# Patient Record
Sex: Female | Born: 1970 | Race: White | Hispanic: No | Marital: Single | Smoking: Never smoker
Health system: Southern US, Community
[De-identification: ages and names within clinical notes are randomized; demographics above are authoritative.]

---

## 2022-03-10 ENCOUNTER — Observation Stay
Admission: EM | Admit: 2022-03-10 | Discharge: 2022-03-11 | Discharge status: Against Medical Advice | Payer: BC Managed Care – PPO | Attending: Internal Medicine | Admitting: Internal Medicine

## 2022-03-10 ENCOUNTER — Other Ambulatory Visit: Payer: Self-pay

## 2022-03-10 ENCOUNTER — Emergency Department: Payer: BC Managed Care – PPO

## 2022-03-10 DIAGNOSIS — I10 Essential (primary) hypertension: Secondary | ICD-10-CM | POA: Insufficient documentation

## 2022-03-10 DIAGNOSIS — Z79899 Other long term (current) drug therapy: Secondary | ICD-10-CM | POA: Diagnosis not present

## 2022-03-10 DIAGNOSIS — I208 Other forms of angina pectoris: Secondary | ICD-10-CM

## 2022-03-10 DIAGNOSIS — R072 Precordial pain: Secondary | ICD-10-CM | POA: Diagnosis not present

## 2022-03-10 DIAGNOSIS — Z20822 Contact with and (suspected) exposure to covid-19: Secondary | ICD-10-CM | POA: Diagnosis not present

## 2022-03-10 DIAGNOSIS — Z7901 Long term (current) use of anticoagulants: Secondary | ICD-10-CM | POA: Diagnosis not present

## 2022-03-10 DIAGNOSIS — I480 Paroxysmal atrial fibrillation: Secondary | ICD-10-CM | POA: Insufficient documentation

## 2022-03-10 DIAGNOSIS — F17210 Nicotine dependence, cigarettes, uncomplicated: Secondary | ICD-10-CM | POA: Diagnosis not present

## 2022-03-10 DIAGNOSIS — R079 Chest pain, unspecified: Secondary | ICD-10-CM | POA: Diagnosis present

## 2022-03-10 DIAGNOSIS — R9431 Abnormal electrocardiogram [ECG] [EKG]: Secondary | ICD-10-CM | POA: Insufficient documentation

## 2022-03-10 LAB — BASIC METABOLIC PANEL
Anion gap: 7 (ref 5–15)
BUN: 11 mg/dL (ref 6–20)
CO2: 25 mmol/L (ref 22–32)
Calcium: 9.7 mg/dL (ref 8.9–10.3)
Chloride: 105 mmol/L (ref 98–111)
Creatinine, Ser: 0.95 mg/dL (ref 0.44–1.00)
GFR, Estimated: >60 mL/min (ref >60)
Glucose, Bld: 93 mg/dL (ref 70–99)
Potassium: 3.9 mmol/L (ref 3.5–5.1)
Sodium: 137 mmol/L (ref 135–145)

## 2022-03-10 LAB — URINE DRUG SCREEN, QUALITATIVE (ARMC ONLY)
Amphetamines, Ur Screen: NOT DETECTED
Barbiturates, Ur Screen: NOT DETECTED
Benzodiazepine, Ur Scrn: POSITIVE — AB
Cannabinoid 50 Ng, Ur ~~LOC~~: POSITIVE — AB
Cocaine Metabolite,Ur ~~LOC~~: NOT DETECTED
MDMA (Ecstasy)Ur Screen: NOT DETECTED
Methadone Scn, Ur: NOT DETECTED
Opiate, Ur Screen: NOT DETECTED
Phencyclidine (PCP) Ur S: NOT DETECTED
Tricyclic, Ur Screen: NOT DETECTED

## 2022-03-10 LAB — CBC
HCT: 45.1 % (ref 36.0–46.0)
Hemoglobin: 15.1 g/dL — ABNORMAL HIGH (ref 12.0–15.0)
MCH: 32 pg (ref 26.0–34.0)
MCHC: 33.5 g/dL (ref 30.0–36.0)
MCV: 95.6 fL (ref 80.0–100.0)
Platelets: 325 10*3/uL (ref 150–400)
RBC: 4.72 MIL/uL (ref 3.87–5.11)
RDW: 12.2 % (ref 11.5–15.5)
WBC: 9.7 10*3/uL (ref 4.0–10.5)
nRBC: 0 % (ref 0.0–0.2)

## 2022-03-10 LAB — TROPONIN I (HIGH SENSITIVITY)
Troponin I (High Sensitivity): 12 ng/L (ref <18)
Troponin I (High Sensitivity): 15 ng/L (ref <18)

## 2022-03-10 LAB — LIPID PANEL
Cholesterol: 252 mg/dL — ABNORMAL HIGH (ref 0–200)
HDL: 53 mg/dL (ref >40)
LDL Cholesterol: 176 mg/dL — ABNORMAL HIGH (ref 0–99)
Total CHOL/HDL Ratio: 4.8 RATIO
Triglycerides: 115 mg/dL (ref <150)
VLDL: 23 mg/dL (ref 0–40)

## 2022-03-10 LAB — RESP PANEL BY RT-PCR (FLU A&B, COVID) ARPGX2
Influenza A by PCR: NEGATIVE
Influenza B by PCR: NEGATIVE
SARS Coronavirus 2 by RT PCR: NEGATIVE

## 2022-03-10 LAB — TSH: TSH: 1.43 u[IU]/mL (ref 0.350–4.500)

## 2022-03-10 MED ORDER — ATORVASTATIN CALCIUM 20 MG PO TABS
40.0000 mg | ORAL_TABLET | Freq: Every day | ORAL | Status: DC
  Administered 2022-03-10: 40 mg via ORAL
  Filled 2022-03-10: qty 2

## 2022-03-10 MED ORDER — ONDANSETRON HCL 4 MG/2ML IJ SOLN: 4.0000 mg | Freq: Four times a day (QID) | INTRAMUSCULAR | Status: DC | PRN

## 2022-03-10 MED ORDER — SERTRALINE HCL 50 MG PO TABS
100.0000 mg | ORAL_TABLET | Freq: Every day | ORAL | Status: DC
  Administered 2022-03-10: 100 mg via ORAL
  Filled 2022-03-10: qty 2

## 2022-03-10 MED ORDER — APIXABAN 5 MG PO TABS
5.0000 mg | ORAL_TABLET | Freq: Two times a day (BID) | ORAL | Status: DC
  Administered 2022-03-10: 5 mg via ORAL
  Filled 2022-03-10 (×2): qty 1

## 2022-03-10 MED ORDER — ACETAMINOPHEN 325 MG PO TABS: 650.0000 mg | ORAL_TABLET | ORAL | Status: DC | PRN

## 2022-03-10 MED ORDER — ASPIRIN 81 MG PO CHEW
324.0000 mg | CHEWABLE_TABLET | Freq: Once | ORAL | Status: DC
Start: 2022-03-10 — End: 2022-03-10
  Filled 2022-03-10: qty 4

## 2022-03-10 MED ORDER — NICOTINE 21 MG/24HR TD PT24: 21.0000 mg | MEDICATED_PATCH | Freq: Every day | TRANSDERMAL | Status: DC

## 2022-03-10 MED ORDER — ALPRAZOLAM 0.5 MG PO TABS
0.5000 mg | ORAL_TABLET | Freq: Once | ORAL | Status: AC
  Administered 2022-03-10: 0.5 mg via ORAL
  Filled 2022-03-10: qty 1

## 2022-03-10 MED ORDER — ONDANSETRON 4 MG PO TBDP: 4.0000 mg | ORAL_TABLET | Freq: Four times a day (QID) | ORAL | Status: DC | PRN

## 2022-03-10 MED ORDER — IBUPROFEN 400 MG PO TABS
600.0000 mg | ORAL_TABLET | Freq: Four times a day (QID) | ORAL | Status: DC | PRN
  Administered 2022-03-10: 600 mg via ORAL
  Filled 2022-03-10: qty 2

## 2022-03-10 MED ORDER — CLONAZEPAM 0.5 MG PO TABS
0.5000 mg | ORAL_TABLET | Freq: Every evening | ORAL | Status: DC | PRN
  Administered 2022-03-10: 0.5 mg via ORAL
  Filled 2022-03-10: qty 1

## 2022-03-10 NOTE — H&P (Addendum)
?History and Physical  ? ? ?Sara Salas URK:[id number] DOB: [birth year] DOA: 03/10/2022 ? ?PCP: Gale Journey, MD (Confirm with patient/family/NH records and if not entered, this has to be entered at [facility] point of entry) ?Patient coming from: Home ? ?I have personally briefly reviewed patient's old medical records in [facility] ? ?Chief Complaint: Chest pain ? ?HPI: Sara Salas is a 51 y.o. female with medical history significant of PAF on Eliquis, HTN, cigar smoker, presented with chest pain.  Symptoms started this morning, while at rest, retrosternal, pressure-like, 8-9/10, associated with shortness of breath.  Episode lasted for about an hour.  There seemed no exacerbation or alleviating factors. ? ?ED Course: Bradycardia, no hypotension ? ?EKG showed T wave inversions on multiple leads which is new.  Troponin negative x2. ? ?Review of Systems: As per HPI otherwise 14 point review of systems negative.  ? ? ?History reviewed. No pertinent past medical history. ? ?History reviewed. No pertinent surgical history. ? ? reports that she has never smoked. She has never used smokeless tobacco. She reports that she does not currently use alcohol. She reports that she does not use drugs. ? ?Not on File ? ?No family history on file. ? ? ?Prior to Admission medications   ?Medication Sig Start Date End Date Taking? Authorizing Provider  ?clonazePAM (KLONOPIN) 0.5 MG tablet Take 1 tablet by mouth at bedtime as needed. 02/13/22 03/15/22 Yes [provider]  ?sertraline (ZOLOFT) 100 MG tablet Take 1 tablet by mouth daily. 02/13/22 02/13/23 Yes [provider]  ? ? ?Physical Exam: ?Vitals:  ? 03/10/22 0932 03/10/22 1245  ?BP: 138/66 120/60  ?Pulse: (!) 41 (!) 44  ?Resp: 18 20  ?Temp: 98 ?F (36.7 ?C)   ?SpO2: 100% 100%  ? ? ?Constitutional: NAD, calm, comfortable ?Vitals:  ? 03/10/22 0932 03/10/22 1245  ?BP: 138/66 120/60  ?Pulse: (!) 41 (!) 44  ?Resp: 18 20  ?Temp: 98 ?F (36.7 ?C)   ?SpO2: 100% 100%  ? ?Eyes:  PERRL, lids and conjunctivae normal ?ENMT: Mucous membranes are moist. Posterior pharynx clear of any exudate or lesions.Normal dentition.  ?Neck: normal, supple, no masses, no thyromegaly ?Respiratory: clear to auscultation bilaterally, no wheezing, no crackles. Normal respiratory effort. No accessory muscle use.  ?Cardiovascular: Regular rate and rhythm, no murmurs / rubs / gallops. No extremity edema. 2+ pedal pulses. No carotid bruits.  ?Abdomen: no tenderness, no masses palpated. No hepatosplenomegaly. Bowel sounds positive.  ?Musculoskeletal: no clubbing / cyanosis. No joint deformity upper and lower extremities. Good ROM, no contractures. Normal muscle tone.  ?Skin: no rashes, lesions, ulcers. No induration ?Neurologic: CN 2-12 grossly intact. Sensation intact, DTR normal. Strength 5/5 in all 4.  ?Psychiatric: Normal judgment and insight. Alert and oriented x 3. Normal mood.  ? ? ? ?Labs on Admission: I have personally reviewed following labs and imaging studies ? ?CBC: ?Recent Labs  ?Lab 03/10/22 ?0954  ?WBC 9.7  ?HGB 15.1*  ?HCT 45.1  ?MCV 95.6  ?PLT 325  ? ?Basic Metabolic Panel: ?Recent Labs  ?Lab 03/10/22 ?0954  ?NA 137  ?K 3.9  ?CL 105  ?CO2 25  ?GLUCOSE 93  ?BUN 11  ?CREATININE 0.95  ?CALCIUM 9.7  ? ?GFR: ?CrCl cannot be calculated (Unknown ideal weight.). ?Liver Function Tests: ?No results for input(s): AST, ALT, ALKPHOS, BILITOT, PROT, ALBUMIN in the last 168 hours. ?No results for input(s): LIPASE, AMYLASE in the last 168 hours. ?No results for input(s): AMMONIA in the last 168 hours. ?Coagulation Profile: ?  No results for input(s): INR, PROTIME in the last 168 hours. ?Cardiac Enzymes: ?No results for input(s): CKTOTAL, CKMB, CKMBINDEX, TROPONINI in the last 168 hours. ?BNP (last 3 results) ?No results for input(s): PROBNP in the last 8760 hours. ?HbA1C: ?No results for input(s): HGBA1C in the last 72 hours. ?CBG: ?No results for input(s): GLUCAP in the last 168 hours. ?Lipid Profile: ?No results for  input(s): CHOL, HDL, LDLCALC, TRIG, CHOLHDL, LDLDIRECT in the last 72 hours. ?Thyroid Function Tests: ?No results for input(s): TSH, T4TOTAL, FREET4, T3FREE, THYROIDAB in the last 72 hours. ?Anemia Panel: ?No results for input(s): VITAMINB12, FOLATE, FERRITIN, TIBC, IRON, RETICCTPCT in the last 72 hours. ?Urine analysis: ?No results found for: COLORURINE, APPEARANCEUR, LABSPEC, PHURINE, GLUCOSEU, HGBUR, BILIRUBINUR, KETONESUR, PROTEINUR, UROBILINOGEN, NITRITE, LEUKOCYTESUR ? ?Radiological Exams on Admission: ?DG Chest 2 View ? ?Result Date: 03/10/2022 ?CLINICAL DATA:  Provided history: Chest pain. Shortness of breath. EXAM: CHEST - 2 VIEW COMPARISON:  No pertinent prior exams available for comparison. FINDINGS: Heart size within normal limits. No appreciable airspace consolidation. No evidence of pleural effusion or pneumothorax. No acute bony abnormality identified. IMPRESSION: No evidence of acute cardiopulmonary abnormality. Electronically Signed   By: Jackey Loge D.O.   On: 03/10/2022 10:32   ? ?EKG: Independently reviewed.  Sinus, wave inversions on lead I, III and aVF, lead V 2 through V6 ? ?Assessment/Plan ?Principal Problem: ?  Chest pain ?Active Problems: ?  Cigarette nicotine dependence ? (please populate well all problems here in Problem List. (For example, if patient is on BP meds at home and you resume or decide to hold them, it is a problem that needs to be her. Same for CAD, COPD, HLD and so on) ? ?Anginal-like chest pain ?-With significant doing of bradycardia and T wave inversions on multiple leads especially lateral and inferior distribution. ?-Discussed with cardiology, stress test tomorrow.  Given the T wave changes, ordered nuclear stress test ?-UDS ? ?Bradycardia ?-Likely secondary to beta-blocker, hold metoprolol today ?-Check TSH ? ?PAF ?-Continue Eliquis ? ?Cigarette smoke ?-Cessation education consultation done ?-Nicotine patch ? ?DVT prophylaxis: SCD ?Code Status: Full code ?Family  Communication: None at bedside ?Disposition Plan: Expect less than 2 midnight hospital stay ?Consults called: Cardiology ?Admission status: Telemetry observation ? ? ?Emeline General MD ?[facility] ?Pager [phone] ? ?03/10/2022, 3:35 PM  ?  ?

## 2022-03-10 NOTE — ED Notes (Signed)
Nuclear med called and needs pt to remain NPO for tomorrows test ?
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
## 2022-03-10 NOTE — ED Triage Notes (Signed)
Pt comes with c/o CP mid sternal and SOB. Pt states this has gotten worse and feels like tightness. Pt states she feels like she can't get a breath.  ?

## 2022-03-10 NOTE — ED Notes (Signed)
Pt found outside in parking lot smoking with IV in place on right arm, telemetry cords still attached to pt. When asked and instructed on policy and procedure of leaving with hospital equipment, pt sts, "I dont care, I am an adult and if I want to go outside, I will. I will just make it to were you all dont see me when I go out." Pt informed again of policy and that staff will need to remove IV at this time. Security and Therapist, art called by Probation officer.  ?

## 2022-03-10 NOTE — ED Notes (Signed)
Pt went missing from room, per reports from previous RN,  Pt here for CP but very adamant on "doing things as she wants , going in-out of her hospital room to smoke outside.  Security officer called to help locate this Pt .  Pt back to room and explained to this Pt she cannot be wandering out the hospital as she is admitted and my responsibility while here. She states she will be murderous if she does not smole and she is old already to be told differently. Pt offered nicotine patch which she declined, stated "they don't want me having it". CN at bedside to explain to patient the same.  ?

## 2022-03-10 NOTE — Progress Notes (Signed)
LDL>170, starting Atorvastin ?

## 2022-03-11 ENCOUNTER — Observation Stay: Payer: BC Managed Care – PPO

## 2022-03-11 ENCOUNTER — Observation Stay (HOSPITAL_BASED_OUTPATIENT_CLINIC_OR_DEPARTMENT_OTHER)
Admit: 2022-03-11 | Discharge: 2022-03-11 | Disposition: A | Discharge status: Home / Self Care | Payer: BC Managed Care – PPO | Attending: Internal Medicine | Admitting: Internal Medicine

## 2022-03-11 DIAGNOSIS — R079 Chest pain, unspecified: Secondary | ICD-10-CM

## 2022-03-11 DIAGNOSIS — F17219 Nicotine dependence, cigarettes, with unspecified nicotine-induced disorders: Secondary | ICD-10-CM | POA: Diagnosis not present

## 2022-03-11 DIAGNOSIS — I208 Other forms of angina pectoris: Secondary | ICD-10-CM | POA: Diagnosis not present

## 2022-03-11 LAB — ECHOCARDIOGRAM COMPLETE
AR max vel: 3.42 cm2
AV Area VTI: 3.56 cm2
AV Area mean vel: 3.16 cm2
AV Mean grad: 2 mmHg
AV Peak grad: 4 mmHg
Ao pk vel: 1 m/s
Area-P 1/2: 4.04 cm2
Height: 59 in
P 1/2 time: 696 msec
S' Lateral: 3.1 cm
Weight: 2112 oz

## 2022-03-11 MED ORDER — SENNOSIDES-DOCUSATE SODIUM 8.6-50 MG PO TABS
1.0000 | ORAL_TABLET | Freq: Every evening | ORAL | Status: DC | PRN
Start: 2022-03-11 — End: 2022-03-11

## 2022-03-11 MED ORDER — HYDRALAZINE HCL 20 MG/ML IJ SOLN: 10.0000 mg | INTRAMUSCULAR | Status: DC | PRN

## 2022-03-11 MED ORDER — METOPROLOL TARTRATE 5 MG/5ML IV SOLN: 5.0000 mg | INTRAVENOUS | Status: DC | PRN

## 2022-03-11 NOTE — ED Notes (Signed)
RN to bedside to introduce self to pt. Pt had walked outside to smoke.  ?

## 2022-03-11 NOTE — ED Notes (Signed)
Pt packed her belongings up and stormed out. Pt leaving AMA.  ?

## 2022-03-11 NOTE — Progress Notes (Signed)
*  PRELIMINARY RESULTS* ?Echocardiogram ?2D Echocardiogram has been performed. ? ?Fayrene Towner, Dorene Sorrow ?03/11/2022, 8:28 AM ?

## 2022-03-11 NOTE — ED Notes (Signed)
Nuclear medicine called. Advised they will not perform ordered test with marijuana in her system and her leaving the building to smoke.  ?

## 2022-03-11 NOTE — ED Notes (Signed)
Unable to update vitals as patient keeps removing monitor cords.  ?

## 2022-03-11 NOTE — ED Notes (Addendum)
Pt is up and out of building. MD made aware.  ?

## 2022-03-11 NOTE — ED Notes (Signed)
Pt is back in her room from going outside of the building. Pt was informed per MD if she leaves again she is to be DC AMA and will start all over.  ?
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
## 2023-01-22 ENCOUNTER — Other Ambulatory Visit: Payer: Self-pay

## 2023-01-22 ENCOUNTER — Emergency Department
Admission: EM | Admit: 2023-01-22 | Discharge: 2023-01-22 | Disposition: A | Discharge status: Home / Self Care | Payer: BC Managed Care – PPO | Attending: Emergency Medicine | Admitting: Emergency Medicine

## 2023-01-22 ENCOUNTER — Emergency Department: Payer: BC Managed Care – PPO

## 2023-01-22 DIAGNOSIS — R0789 Other chest pain: Secondary | ICD-10-CM

## 2023-01-22 DIAGNOSIS — K219 Gastro-esophageal reflux disease without esophagitis: Secondary | ICD-10-CM

## 2023-01-22 LAB — BASIC METABOLIC PANEL
Anion gap: 11 (ref 5–15)
BUN: 8 mg/dL (ref 6–20)
CO2: 23 mmol/L (ref 22–32)
Calcium: 9.1 mg/dL (ref 8.9–10.3)
Chloride: 104 mmol/L (ref 98–111)
Creatinine, Ser: 0.85 mg/dL (ref 0.44–1.00)
GFR, Estimated: >60 mL/min (ref >60)
Glucose, Bld: 110 mg/dL — ABNORMAL HIGH (ref 70–99)
Potassium: 3.4 mmol/L — ABNORMAL LOW (ref 3.5–5.1)
Sodium: 138 mmol/L (ref 135–145)

## 2023-01-22 LAB — CBC
HCT: 42.2 % (ref 36.0–46.0)
Hemoglobin: 14.3 g/dL (ref 12.0–15.0)
MCH: 32.7 pg (ref 26.0–34.0)
MCHC: 33.9 g/dL (ref 30.0–36.0)
MCV: 96.6 fL (ref 80.0–100.0)
Platelets: 269 10*3/uL (ref 150–400)
RBC: 4.37 MIL/uL (ref 3.87–5.11)
RDW: 12.2 % (ref 11.5–15.5)
WBC: 8.8 10*3/uL (ref 4.0–10.5)
nRBC: 0 % (ref 0.0–0.2)

## 2023-01-22 LAB — TROPONIN I (HIGH SENSITIVITY): Troponin I (High Sensitivity): 4 ng/L (ref <18)

## 2023-01-22 NOTE — Discharge Instructions (Signed)
Your tests in the emergency department today were all okay.  Continue taking your home medications, and add Pepcid (famotidine) 20 mg 2 times a day for the next few days to ensure that your symptoms do not come back.

## 2023-01-22 NOTE — ED Provider Notes (Signed)
[facility] Provider Note    Event Date/Time   First MD Initiated Contact with Patient 01/22/23 1345     (approximate)   History   Chief Complaint: Chest Pain   HPI  Sara Salas is a 52 y.o. female who comes ED complaining of chest pain.  She reports that she was in her usual state of health through yesterday, worked her usual 2 jobs and therefore did not eat from 10 AM to 10:00 PM last night.  When she did eat, she ate fried scallops and then went to bed, I then woke up this morning with burning pain in her left chest, nonradiating, no shortness of breath diaphoresis or vomiting, no exertional symptoms.  Symptoms are now resolved.  No recent exertional symptoms.  She has been compliant with her medications.     Physical Exam   Triage Vital Signs: ED Triage Vitals  Enc Vitals Group     BP 01/22/23 1255 125/65     Pulse Rate 01/22/23 1255 (!) 59     Resp 01/22/23 1255 18     Temp 01/22/23 1255 98.6 F (37 C)     Temp Source 01/22/23 1255 Oral     SpO2 01/22/23 1255 98 %     Weight 01/22/23 1253 132 lb 0.9 oz (59.9 kg)     Height 01/22/23 1253 4\' 11"  (1.499 m)     Head Circumference --      Peak Flow --      Pain Score 01/22/23 1252 4     Pain Loc --      Pain Edu? --      Excl. in [place]? --     Most recent vital signs: Vitals:   01/22/23 1255  BP: 125/65  Pulse: (!) 59  Resp: 18  Temp: 98.6 F (37 C)  SpO2: 98%    General: Awake, no distress.  CV:  Good peripheral perfusion.  Regular rate rhythm Resp:  Normal effort.  Clear to auscultation bilaterally Abd:  No distention.  Soft nontender Other:  Energetic, no lower extremity edema   ED Results / Procedures / Treatments   Labs (all labs ordered are listed, but only abnormal results are displayed) Labs Reviewed  BASIC METABOLIC PANEL - Abnormal; Notable for the following components:      Result Value   Potassium 3.4 (*)    Glucose, Bld 110 (*)    All other components within  normal limits  CBC  POC URINE PREG, ED  TROPONIN I (HIGH SENSITIVITY)     EKG Interpreted by me Sinus rhythm rate of 59.  Normal axis intervals QRS ST segments and T waves   RADIOLOGY Chest x-ray interpreted by me, appears normal.  Radiology report reviewed   PROCEDURES:  Procedures   MEDICATIONS ORDERED IN ED: Medications - No data to display   IMPRESSION / MDM / [place] / ED COURSE  I reviewed the triage vital signs and the nursing notes.  DDx: GERD/gastritis, anemia, electrolyte abnormality, non-STEMI  Patient's presentation is most consistent with acute presentation with potential threat to life or bodily function.  Patient presents with atypical chest pain, very suspicious for GERD.  Currently symptoms are resolved, vital signs and exam are normal.  EKG chest x-ray and labs are all okay.  Cardiac workup not indicated.  Doubt ACS PE dissection pericarditis or abdominal pathology.  She will try famotidine.  Stable for discharge       FINAL CLINICAL IMPRESSION(S) /  ED DIAGNOSES   Final diagnoses:  Atypical chest pain  Gastroesophageal reflux disease without esophagitis     Rx / DC Orders   ED Discharge Orders     None        Note:  This document was prepared using Dragon voice recognition software and may include unintentional dictation errors.   Carrie Mew, MD 01/22/23 [phone]

## 2023-01-22 NOTE — ED Notes (Signed)
POC preg negative. 

## 2023-01-22 NOTE — ED Triage Notes (Signed)
Pt here with cp. Pt states the pain started last night. Pt states the pain is intermittent and is dull in nature.

## 2023-05-28 IMAGING — CR DG CHEST 2V
2 series · 2 of 2 positions shown · non-contrast
Comparison: No pertinent prior exams available for comparison.

CLINICAL DATA: Provided history: Chest pain. Shortness of breath.

EXAM:
CHEST - 2 VIEW

[chest pa]
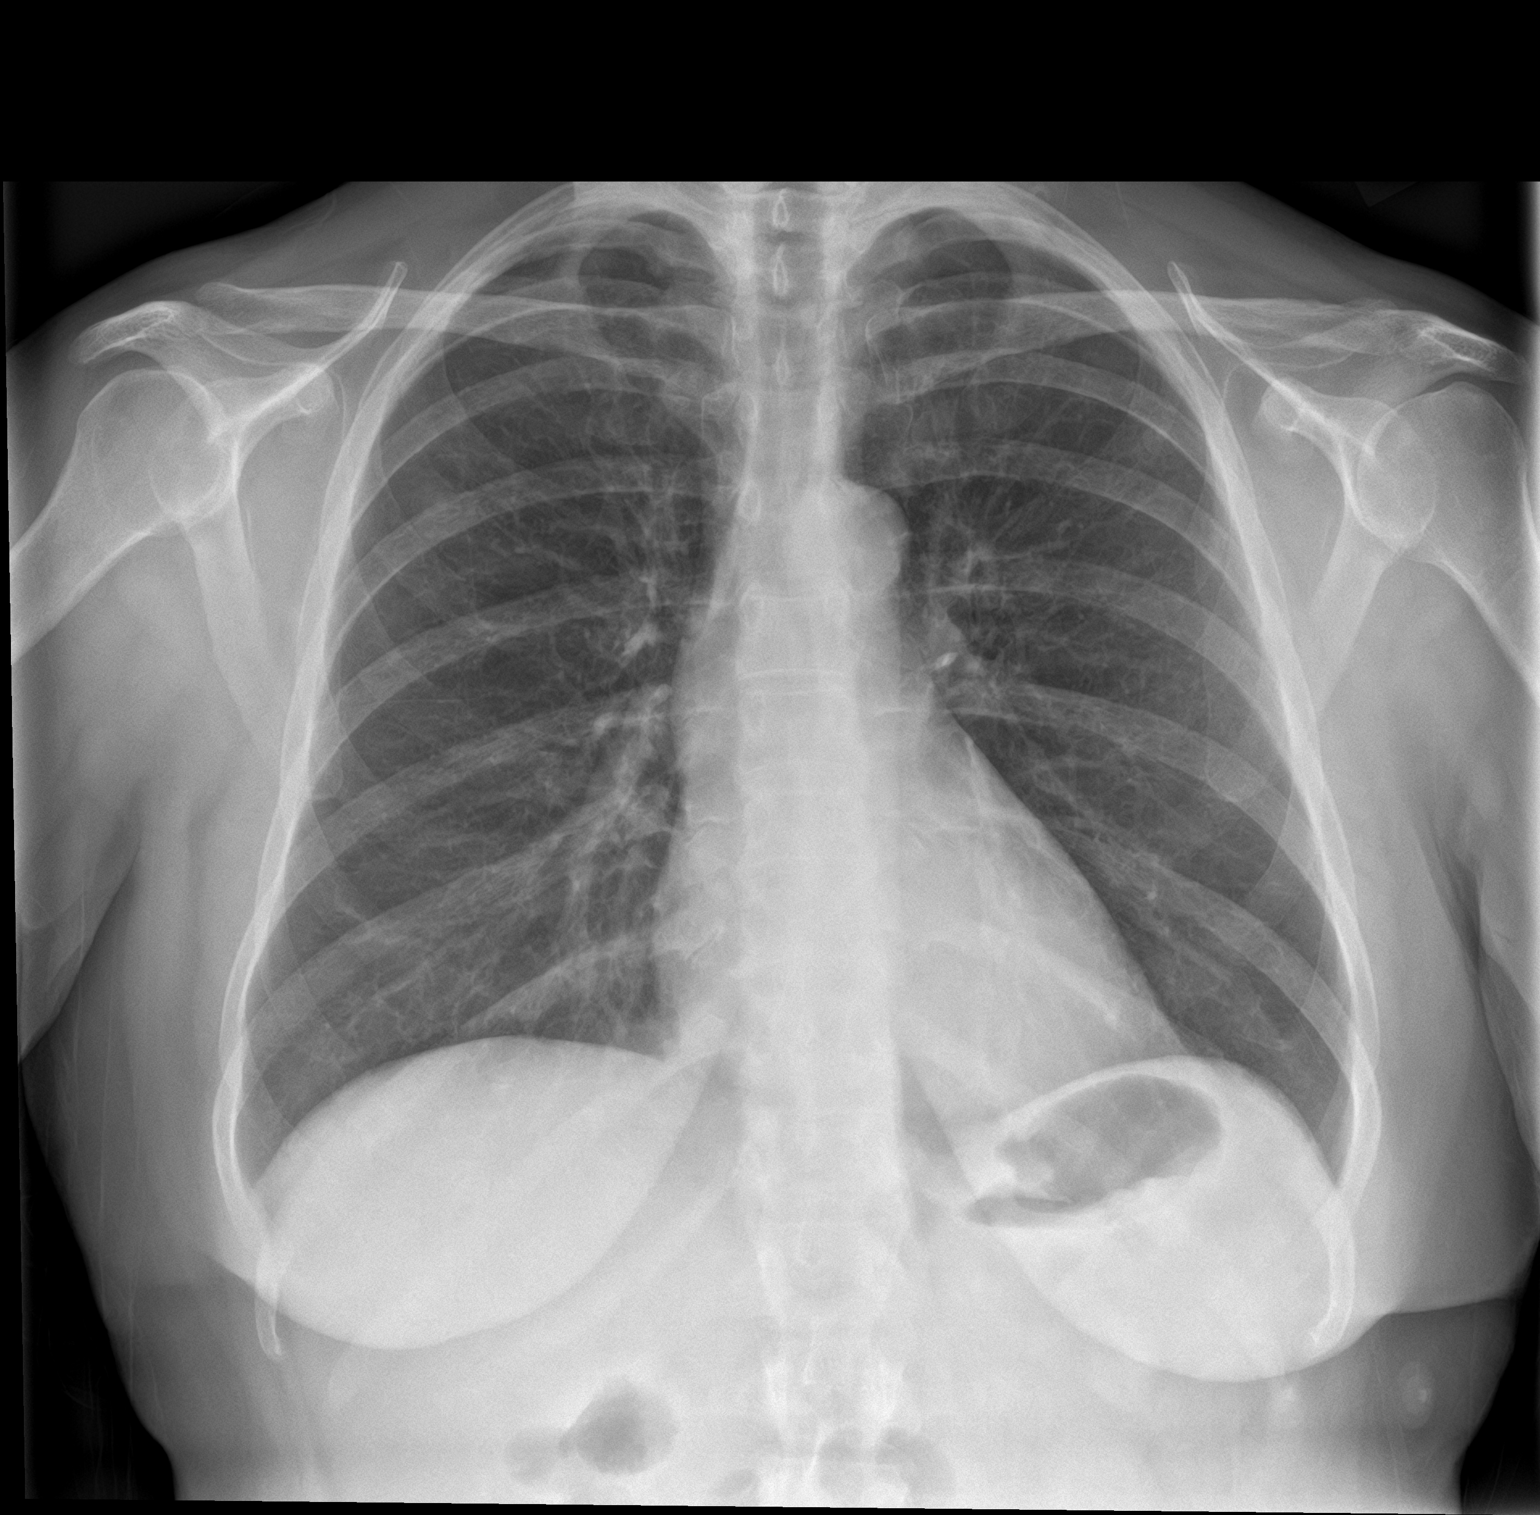

[chest lat]
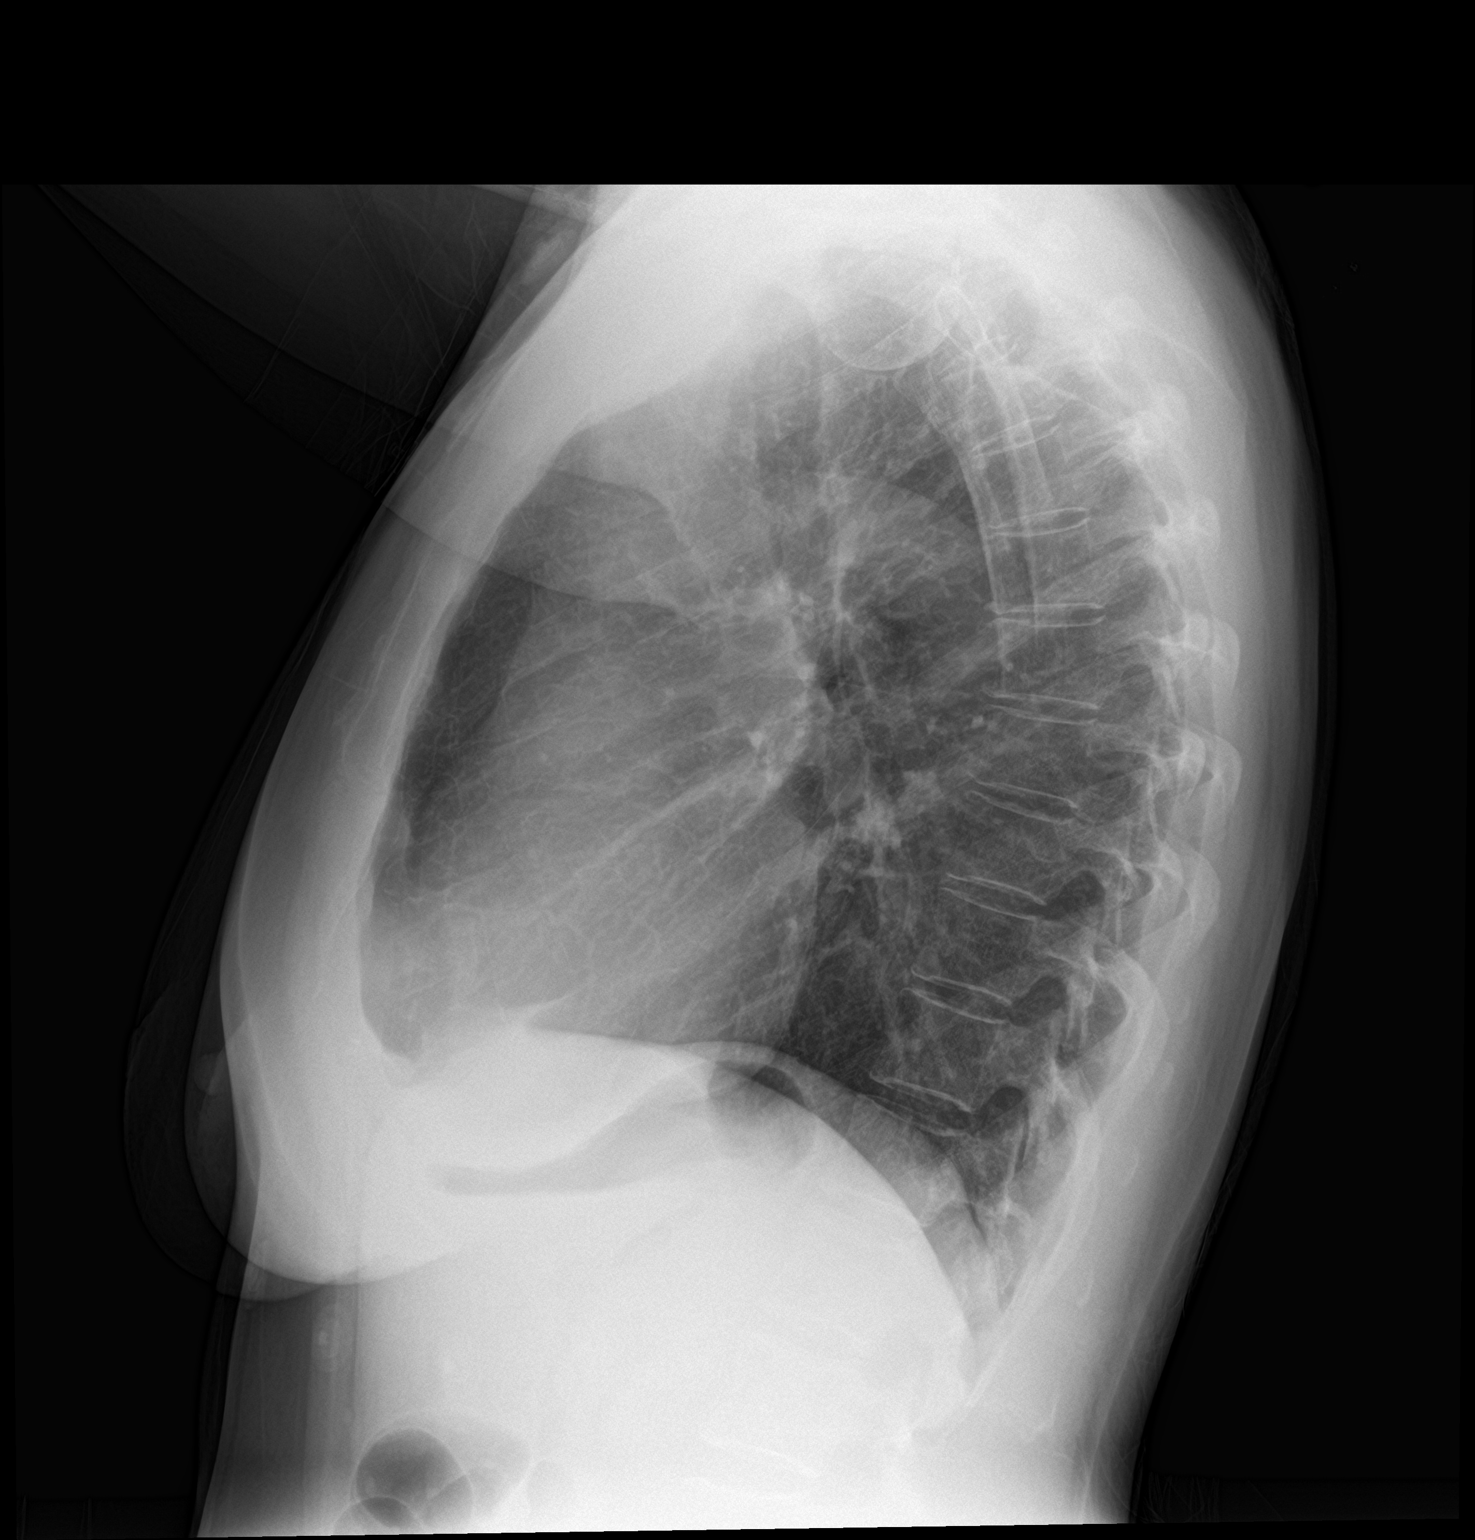

[2 of 2 positions shown; findings below may reference images not displayed]

FINDINGS: Heart size within normal limits. No appreciable airspace
consolidation. No evidence of pleural effusion or pneumothorax. No
acute bony abnormality identified.
IMPRESSION: No evidence of acute cardiopulmonary abnormality.

## 2023-10-15 ENCOUNTER — Ambulatory Visit
Admission: EM | Admit: 2023-10-15 | Discharge: 2023-10-15 | Disposition: A | Discharge status: Home / Self Care | Payer: BC Managed Care – PPO

## 2023-10-15 DIAGNOSIS — J069 Acute upper respiratory infection, unspecified: Secondary | ICD-10-CM

## 2023-10-15 DIAGNOSIS — H66003 Acute suppurative otitis media without spontaneous rupture of ear drum, bilateral: Secondary | ICD-10-CM | POA: Diagnosis not present

## 2023-10-15 MED ORDER — AMOXICILLIN-POT CLAVULANATE 875-125 MG PO TABS: 1.0000 | ORAL_TABLET | Freq: Two times a day (BID) | ORAL | 0 refills | Status: AC

## 2023-10-15 MED ORDER — IPRATROPIUM BROMIDE 0.06 % NA SOLN: 2.0000 | Freq: Four times a day (QID) | NASAL | 12 refills | Status: AC

## 2023-10-15 MED ORDER — PROMETHAZINE-DM 6.25-15 MG/5ML PO SYRP: 5.0000 mL | ORAL_SOLUTION | Freq: Four times a day (QID) | ORAL | 0 refills | Status: DC | PRN

## 2023-10-15 MED ORDER — BENZONATATE 100 MG PO CAPS: 200.0000 mg | ORAL_CAPSULE | Freq: Three times a day (TID) | ORAL | 0 refills | Status: AC

## 2023-10-15 NOTE — ED Provider Notes (Signed)
[facility]    CSN: [id number] Arrival date & time: 10/15/23  1210      History   Chief Complaint Chief Complaint  Patient presents with   Cough   Generalized Body Aches   Ear Pain    HPI Sara Salas is a 52 y.o. female.   HPI  52 year old female with a past medical history significant for SVT, A-fib, anxiety, and depression presents for evaluation of 5 days worth of respiratory symptoms to include body aches, bilateral ear pain, nasal congestion with clear nasal discharge, sore throat, cough is productive for clear sputum, and wheezing.  She reports that her albuterol inhaler has helped the wheezing.  She denies any fever or shortness of breath.  Patient's blood pressure is low at 74/49 with a heart rate of 51 at triage.  Patient reports that her blood pressure always runs very low and she is denying any dizziness or syncope.  History reviewed. No pertinent past medical history.  Patient Active Problem List   Diagnosis Date Noted   Chest pain 03/10/2022   Cigarette nicotine dependence 03/10/2022    History reviewed. No pertinent surgical history.  OB History   No obstetric history on file.      Home Medications    Prior to Admission medications   Medication Sig Start Date End Date Taking? Authorizing Provider  amoxicillin-clavulanate (AUGMENTIN) 875-125 MG tablet Take 1 tablet by mouth every 12 (twelve) hours for 10 days. 10/15/23 10/25/23 Yes Becky Augusta, NP  apixaban (ELIQUIS) 5 MG TABS tablet Take 5 mg by mouth 2 (two) times daily.   Yes [provider]  benzonatate (TESSALON) 100 MG capsule Take 2 capsules (200 mg total) by mouth every 8 (eight) hours. 10/15/23  Yes Becky Augusta, NP  ipratropium (ATROVENT) 0.06 % nasal spray Place 2 sprays into both nostrils 4 (four) times daily. 10/15/23  Yes Becky Augusta, NP  promethazine-dextromethorphan (PROMETHAZINE-DM) 6.25-15 MG/5ML syrup Take 5 mLs by mouth 4 (four) times daily as needed. 10/15/23   Yes Becky Augusta, NP  zolpidem (AMBIEN CR) 12.5 MG CR tablet Take 12.5 mg by mouth at bedtime as needed.   Yes [provider]  albuterol (VENTOLIN HFA) 108 (90 Base) MCG/ACT inhaler Inhale 2 puffs into the lungs every 4 (four) hours as needed. 10/04/22 10/04/23  [provider]  clonazePAM (KLONOPIN) 0.5 MG tablet Take 1 tablet by mouth at bedtime as needed. 02/13/22 03/15/22  [provider]  DULoxetine (CYMBALTA) 30 MG capsule Take 30 mg by mouth daily.   Yes [provider]  sertraline (ZOLOFT) 100 MG tablet Take 1 tablet by mouth daily. 02/13/22 02/13/23  [provider]    Family History History reviewed. No pertinent family history.  Social History Social History   Tobacco Use   Smoking status: Every Day    Types: Cigarettes   Smokeless tobacco: Never  Vaping Use   Vaping status: Never Used  Substance Use Topics   Alcohol use: Not Currently   Drug use: Never     Allergies   Patient has no known allergies.   Review of Systems Review of Systems  Constitutional:  Negative for fever.  HENT:  Positive for congestion, ear pain, rhinorrhea and sore throat.   Respiratory:  Positive for cough and wheezing. Negative for shortness of breath.   Neurological:  Negative for dizziness.     Physical Exam Triage Vital Signs ED Triage Vitals  Encounter Vitals Group     BP 10/15/23 1219 Marland Kitchen)  74/49     Systolic BP Percentile --      Diastolic BP Percentile --      Pulse Rate 10/15/23 1219 (!) 51     Resp --      Temp 10/15/23 1219 98.1 F (36.7 C)     Temp Source 10/15/23 1219 Oral     SpO2 10/15/23 1219 98 %     Weight 10/15/23 1217 121 lb (54.9 kg)     Height 10/15/23 1217 4\' 11"  (1.499 m)     Head Circumference --      Peak Flow --      Pain Score 10/15/23 1217 3     Pain Loc --      Pain Education --      Exclude from Growth Chart --    No data found.  Updated Vital Signs BP (!) 74/49 (BP Location: Left Arm)   Pulse (!) 51    Temp 98.1 F (36.7 C) (Oral)   Ht 4\' 11"  (1.499 m)   Wt 121 lb (54.9 kg)   SpO2 98%   BMI 24.44 kg/m   Visual Acuity Right Eye Distance:   Left Eye Distance:   Bilateral Distance:    Right Eye Near:   Left Eye Near:    Bilateral Near:     Physical Exam Vitals and nursing note reviewed.  Constitutional:      Appearance: Normal appearance. She is not ill-appearing.  HENT:     Head: Normocephalic and atraumatic.     Right Ear: Ear canal and external ear normal. There is no impacted cerumen.     Left Ear: Ear canal and external ear normal. There is no impacted cerumen.     Ears:     Comments: Bilateral tympanic membranes are erythematous and injected.  Both EACs are clear.    Nose: Congestion and rhinorrhea present.     Comments: Crecencio Mc is erythematous edematous with clear discharge in both nares.    Mouth/Throat:     Mouth: Mucous membranes are moist.     Pharynx: Oropharynx is clear. Posterior oropharyngeal erythema present. No oropharyngeal exudate.     Comments: Tonsillar pillars are unremarkable.  Posterior oropharynx demonstrates erythema with clear postnasal drip. Cardiovascular:     Rate and Rhythm: Normal rate and regular rhythm.     Pulses: Normal pulses.     Heart sounds: Normal heart sounds. No murmur heard.    No friction rub. No gallop.  Pulmonary:     Effort: Pulmonary effort is normal.     Breath sounds: Normal breath sounds. No wheezing, rhonchi or rales.  Musculoskeletal:     Cervical back: Normal range of motion and neck supple. No tenderness.  Lymphadenopathy:     Cervical: No cervical adenopathy.  Skin:    General: Skin is warm and dry.     Capillary Refill: Capillary refill takes less than 2 seconds.     Findings: No erythema.  Neurological:     General: No focal deficit present.     Mental Status: She is alert and oriented to person, place, and time.      UC Treatments / Results  Labs (all labs ordered are listed, but only abnormal results  are displayed) Labs Reviewed - No data to display  EKG   Radiology No results found.  Procedures Procedures (including critical care time)  Medications Ordered in UC Medications - No data to display  Initial Impression / Assessment and Plan / UC Course  I have reviewed the triage vital signs and the nursing notes.  Pertinent labs & imaging results that were available during my care of the patient were reviewed by me and considered in my medical decision making (see chart for details).   Patient is a pleasant, nontoxic-appearing 52 year old female presenting for evaluation of 1 week worth of respiratory symptoms as outlined HPI above.  Her physical exam does reveal bilateral otitis media as evidenced by the erythematous and injected tympanic membranes.  She also simply needs to mucosa but her nasal discharge is clear.  Posterior oropharyngeal erythema with clear postnasal drip is also present.  Cardiopulmonary dam is benign.  Patient's blood pressure is low with a BP of 74/49.  She reports that her blood pressure does typically run low and she denies any dizziness or syncope.  Also no chest pain or shortness of breath.  Review of historic trends in epic reveals blood pressure ranging from 109-116 systolic to 69-78 diastolic.  I will have staff recheck manual BP prior to discharge.  Plan is to discharge patient on the diagnosis of bilateral otitis media and URI on Augmentin 875 twice daily for 10 days.  Atrovent nasal spray to help with nasal congestion all Tessalon Perles and Promethazine DM cough syrup for cough and congestion.  Repeat manual blood pressure is 104/52.   Final Clinical Impressions(s) / UC Diagnoses   Final diagnoses:  Upper respiratory tract infection, unspecified type  Non-recurrent acute suppurative otitis media of both ears without spontaneous rupture of tympanic membranes     Discharge Instructions      Take the Augmentin twice daily for 10 days with food for  treatment of your ear infection.  Take an over-the-counter probiotic 1 hour after each dose of antibiotic to prevent diarrhea.  Use over-the-counter Tylenol and ibuprofen as needed for pain or fever.  Place a hot water bottle, or heating pad, underneath your pillowcase at night to help dilate up your ear and aid in pain relief as well as resolution of the infection.  Use the Atrovent nasal spray, 2 squirts in each nostril every 6 hours, as needed for runny nose and postnasal drip.  Use the Tessalon Perles every 8 hours during the day.  Take them with a small sip of water.  They may give you some numbness to the base of your tongue or a metallic taste in your mouth, this is normal.  Use the Promethazine DM cough syrup at bedtime for cough and congestion.  It will make you drowsy so do not take it during the day.  Return for reevaluation or see your primary care provider for any new or worsening symptoms.      ED Prescriptions     Medication Sig Dispense Auth. Provider   amoxicillin-clavulanate (AUGMENTIN) 875-125 MG tablet Take 1 tablet by mouth every 12 (twelve) hours for 10 days. 20 tablet Becky Augusta, NP   benzonatate (TESSALON) 100 MG capsule Take 2 capsules (200 mg total) by mouth every 8 (eight) hours. 21 capsule Becky Augusta, NP   ipratropium (ATROVENT) 0.06 % nasal spray Place 2 sprays into both nostrils 4 (four) times daily. 15 mL Becky Augusta, NP   promethazine-dextromethorphan (PROMETHAZINE-DM) 6.25-15 MG/5ML syrup Take 5 mLs by mouth 4 (four) times daily as needed. 118 mL Becky Augusta, NP      PDMP not reviewed this encounter.   Becky Augusta, NP 10/15/23 1244

## 2023-10-15 NOTE — Discharge Instructions (Signed)
Take the Augmentin twice daily for 10 days with food for treatment of your ear infection.  Take an over-the-counter probiotic 1 hour after each dose of antibiotic to prevent diarrhea.  Use over-the-counter Tylenol and ibuprofen as needed for pain or fever.  Place a hot water bottle, or heating pad, underneath your pillowcase at night to help dilate up your ear and aid in pain relief as well as resolution of the infection.  Use the Atrovent nasal spray, 2 squirts in each nostril every 6 hours, as needed for runny nose and postnasal drip.  Use the Tessalon Perles every 8 hours during the day.  Take them with a small sip of water.  They may give you some numbness to the base of your tongue or a metallic taste in your mouth, this is normal.  Use the Promethazine DM cough syrup at bedtime for cough and congestion.  It will make you drowsy so do not take it during the day.  Return for reevaluation or see your primary care provider for any new or worsening symptoms.   

## 2023-10-15 NOTE — ED Triage Notes (Signed)
Pt c/o cough, body aches, sore throat, ear pain x5days  Pt states that her symptoms have gotten worse

## 2023-10-18 ENCOUNTER — Encounter: Payer: Self-pay | Admitting: Emergency Medicine

## 2023-10-18 ENCOUNTER — Ambulatory Visit (INDEPENDENT_AMBULATORY_CARE_PROVIDER_SITE_OTHER): Payer: BC Managed Care – PPO

## 2023-10-18 ENCOUNTER — Ambulatory Visit
Admission: EM | Admit: 2023-10-18 | Discharge: 2023-10-18 | Disposition: A | Discharge status: Home / Self Care | Payer: BC Managed Care – PPO | Attending: Physician Assistant | Admitting: Physician Assistant

## 2023-10-18 DIAGNOSIS — R051 Acute cough: Secondary | ICD-10-CM | POA: Diagnosis not present

## 2023-10-18 DIAGNOSIS — J209 Acute bronchitis, unspecified: Secondary | ICD-10-CM | POA: Diagnosis not present

## 2023-10-18 MED ORDER — PREDNISONE 20 MG PO TABS: 40.0000 mg | ORAL_TABLET | Freq: Every day | ORAL | 0 refills | Status: AC

## 2023-10-18 MED ORDER — GUAIFENESIN-CODEINE 100-10 MG/5ML PO SOLN: 10.0000 mL | Freq: Two times a day (BID) | ORAL | 0 refills | Status: AC | PRN

## 2023-10-18 NOTE — ED Provider Notes (Addendum)
[facility]    CSN: [id number] Arrival date & time: 10/18/23  1456      History   Chief Complaint Chief Complaint  Patient presents with   Cough    HPI Sara Salas is a 52 y.o. female with history of tobacco abuse, a-fib on anticoagulation, hyperlipidemia, and previous CVA.  Patient was seen in this department 3 days ago.  Reported being ill for 5 days with cough and congestion.  Diagnosed with upper respiratory infection and ear infection by another provider.  Placed on Augmentin, benzonatate, Atrovent nasal spray and Promethazine DM.  Patient says ear pain and sore throat has improved.  Reports cough has not gotten any better.  It is productive of discolored sputum.  It does keep her up at night.  Benzonatate helps during the day but she think she needs something stronger for nighttime.  She denies associated fever or shortness of breath.  No other complaints.  HPI  History reviewed. No pertinent past medical history.  Patient Active Problem List   Diagnosis Date Noted   Chest pain 03/10/2022   Cigarette nicotine dependence 03/10/2022    History reviewed. No pertinent surgical history.  OB History   No obstetric history on file.      Home Medications    Prior to Admission medications   Medication Sig Start Date End Date Taking? Authorizing Provider  atorvastatin (LIPITOR) 20 MG tablet Take by mouth. 08/02/23 08/01/24 Yes [provider]  guaiFENesin-codeine 100-10 MG/5ML syrup Take 10 mLs by mouth 2 (two) times daily as needed for cough. 10/18/23  Yes Shirlee Latch, PA-C  lamoTRIgine (LAMICTAL) 25 MG tablet Take by mouth. 10/04/23  Yes [provider]  metoprolol succinate (TOPROL-XL) 25 MG 24 hr tablet Take 1 tablet by mouth daily. 10/13/23  Yes [provider]  predniSONE (DELTASONE) 20 MG tablet Take 2 tablets (40 mg total) by mouth daily for 5 days. 10/18/23 10/23/23 Yes Shirlee Latch, PA-C  albuterol (VENTOLIN HFA) 108  (90 Base) MCG/ACT inhaler Inhale 2 puffs into the lungs every 4 (four) hours as needed. 10/04/22 10/04/23  [provider]  amoxicillin-clavulanate (AUGMENTIN) 875-125 MG tablet Take 1 tablet by mouth every 12 (twelve) hours for 10 days. 10/15/23 10/25/23  Becky Augusta, NP  apixaban (ELIQUIS) 5 MG TABS tablet Take 5 mg by mouth 2 (two) times daily.    [provider]  benzonatate (TESSALON) 100 MG capsule Take 2 capsules (200 mg total) by mouth every 8 (eight) hours. 10/15/23   Becky Augusta, NP  clonazePAM (KLONOPIN) 0.5 MG tablet Take 1 tablet by mouth at bedtime as needed. 02/13/22 03/15/22  [provider]  DULoxetine (CYMBALTA) 30 MG capsule Take 30 mg by mouth daily.    [provider]  ipratropium (ATROVENT) 0.06 % nasal spray Place 2 sprays into both nostrils 4 (four) times daily. 10/15/23   Becky Augusta, NP  sertraline (ZOLOFT) 100 MG tablet Take 1 tablet by mouth daily. 02/13/22 02/13/23  [provider]  zolpidem (AMBIEN CR) 12.5 MG CR tablet Take 12.5 mg by mouth at bedtime as needed.    [provider]    Family History No family history on file.  Social History Social History   Tobacco Use   Smoking status: Every Day    Types: Cigarettes   Smokeless tobacco: Never  Vaping Use   Vaping status: Never Used  Substance Use Topics   Alcohol use: Not Currently   Drug use: Never  Allergies   Pineapple   Review of Systems Review of Systems  Constitutional:  Positive for fatigue. Negative for chills, diaphoresis and fever.  HENT:  Positive for congestion and rhinorrhea. Negative for ear pain, sinus pressure, sinus pain and sore throat.   Respiratory:  Positive for cough. Negative for shortness of breath and wheezing.   Cardiovascular:  Negative for chest pain.  Gastrointestinal:  Negative for abdominal pain, nausea and vomiting.  Musculoskeletal:  Negative for arthralgias and myalgias.  Skin:  Negative for rash.   Neurological:  Negative for weakness and headaches.  Hematological:  Negative for adenopathy.  Psychiatric/Behavioral:  Positive for sleep disturbance.      Physical Exam Triage Vital Signs ED Triage Vitals  Encounter Vitals Group     BP      Systolic BP Percentile      Diastolic BP Percentile      Pulse      Resp      Temp      Temp src      SpO2      Weight      Height      Head Circumference      Peak Flow      Pain Score      Pain Loc      Pain Education      Exclude from Growth Chart    No data found.  Updated Vital Signs BP 122/64 (BP Location: Left Arm)   Pulse (!) 52   Temp 98 F (36.7 C) (Oral)   Resp 18   SpO2 96%      Physical Exam Vitals and nursing note reviewed.  Constitutional:      General: She is not in acute distress.    Appearance: Normal appearance. She is ill-appearing. She is not toxic-appearing.  HENT:     Head: Normocephalic and atraumatic.     Nose: Congestion present.     Mouth/Throat:     Mouth: Mucous membranes are moist.     Pharynx: Oropharynx is clear.  Eyes:     General: No scleral icterus.       Right eye: No discharge.        Left eye: No discharge.     Conjunctiva/sclera: Conjunctivae normal.  Cardiovascular:     Rate and Rhythm: Regular rhythm. Bradycardia present.     Heart sounds: Normal heart sounds.  Pulmonary:     Effort: Pulmonary effort is normal. No respiratory distress.     Breath sounds: Rhonchi present.  Musculoskeletal:     Cervical back: Neck supple.  Skin:    General: Skin is dry.  Neurological:     General: No focal deficit present.     Mental Status: She is alert. Mental status is at baseline.     Motor: No weakness.     Gait: Gait normal.  Psychiatric:        Mood and Affect: Mood normal.        Behavior: Behavior normal.      UC Treatments / Results  Labs (all labs ordered are listed, but only abnormal results are displayed) Labs Reviewed - No data to  display  EKG   Radiology No results found.  Procedures Procedures (including critical care time)  Medications Ordered in UC Medications - No data to display  Initial Impression / Assessment and Plan / UC Course  I have reviewed the triage vital signs and the nursing notes.  Pertinent labs & imaging results that  were available during my care of the patient were reviewed by me and considered in my medical decision making (see chart for details).   52 year old female presents for cough, congestion, body aches, bilateral ear pain, nasal congestion and wheezing x 8 days.  Seen 3 days ago and given Augmentin and cough medication.  Cough medicine is helpful during the day but not at night.  Vitals are stable.  She is afebrile.  Ill-appearing but nontoxic.  Coughs frequently.  On exam is nasal congestion.  Throat clear.  Few scattered rhonchi.  Chest x-ray obtained to assess for possible pneumonia.  No obvious pneumonia when compared to chest x-ray from earlier this year.  Advised patient of wet read results.  Explained to her that I will add additional antibiotics such as azithromycin if there is pneumonia present on her x-ray.  At this time continue Augmentin.  Printed prescription for prednisone for her to begin if she is not showing to feel better in the next few days.  Explained to her that viral illnesses/bronchitis can last for a few weeks.  I prescribed Cheratussin to use twice a day as needed for severe cough.  Increase rest and fluids.  Reviewed returning for fever or acute worsening of symptoms.  Work note given.  Chest x-ray negative.  Final Clinical Impressions(s) / UC Diagnoses   Final diagnoses:  Acute bronchitis, unspecified organism  Acute cough     Discharge Instructions      -I will call you if the chest x-ray shows pneumonia and add another antibiotic to the Augmentin.  Go ahead and finish out the Augmentin. - I sent you a stronger cough syrup for nighttime.   Continue with the Tessalon Perles during the day. - Increase rest and fluids. - Fill the prednisone if you notice you are not feeling better over the next week. - If you develop a fever or have worsening of your breathing return for reevaluation.     ED Prescriptions     Medication Sig Dispense Auth. Provider   predniSONE (DELTASONE) 20 MG tablet Take 2 tablets (40 mg total) by mouth daily for 5 days. 10 tablet Eusebio Friendly B, PA-C   guaiFENesin-codeine 100-10 MG/5ML syrup Take 10 mLs by mouth 2 (two) times daily as needed for cough. 120 mL Shirlee Latch, PA-C      I have reviewed the PDMP during this encounter.   Shirlee Latch, PA-C 10/18/23 1729    Shirlee Latch, PA-C 10/18/23 1729

## 2023-10-18 NOTE — Discharge Instructions (Signed)
-  I will call you if the chest x-ray shows pneumonia and add another antibiotic to the Augmentin.  Go ahead and finish out the Augmentin. - I sent you a stronger cough syrup for nighttime.  Continue with the Tessalon Perles during the day. - Increase rest and fluids. - Fill the prednisone if you notice you are not feeling better over the next week. - If you develop a fever or have worsening of your breathing return for reevaluation.

## 2023-10-18 NOTE — ED Triage Notes (Signed)
Pt was seen 10/15/23 and states her cough is getting worse.
